# Patient Record
Sex: Male | Born: 2000 | Race: White | Hispanic: No | Marital: Single | Smoking: Never smoker
Health system: Southern US, Community
[De-identification: ages and names within clinical notes are randomized; demographics above are authoritative.]

## PROBLEM LIST (undated history)

## (undated) DIAGNOSIS — S00432A Contusion of left ear, initial encounter: Secondary | ICD-10-CM

---
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
## 2023-02-15 ENCOUNTER — Ambulatory Visit (INDEPENDENT_AMBULATORY_CARE_PROVIDER_SITE_OTHER): Payer: No Typology Code available for payment source | Admitting: Internal Medicine

## 2023-02-15 VITALS — BP 137/84 | HR 83 | Ht 74.0 in | Wt 207.0 lb

## 2023-02-15 DIAGNOSIS — Z114 Encounter for screening for human immunodeficiency virus [HIV]: Secondary | ICD-10-CM

## 2023-02-15 DIAGNOSIS — Z1321 Encounter for screening for nutritional disorder: Secondary | ICD-10-CM

## 2023-02-15 DIAGNOSIS — Z1329 Encounter for screening for other suspected endocrine disorder: Secondary | ICD-10-CM

## 2023-02-15 DIAGNOSIS — Z0001 Encounter for general adult medical examination with abnormal findings: Secondary | ICD-10-CM

## 2023-02-15 DIAGNOSIS — Z1159 Encounter for screening for other viral diseases: Secondary | ICD-10-CM | POA: Diagnosis not present

## 2023-02-15 DIAGNOSIS — Z131 Encounter for screening for diabetes mellitus: Secondary | ICD-10-CM | POA: Diagnosis not present

## 2023-02-15 DIAGNOSIS — Z13220 Encounter for screening for lipoid disorders: Secondary | ICD-10-CM | POA: Diagnosis not present

## 2023-02-15 NOTE — Progress Notes (Signed)
New Patient Office Visit  Subjective    Patient ID: Max Williams, male    DOB: 2001-06-06  Age: 22 y.o. MRN: [id number]  CC:  Chief Complaint  Patient presents with   New Patient (Initial Visit)    Establishing care, pt reports a bump/lump on testicle has concerns.    HPI Max Williams presents to establish care and for physical exam.  He is a 22 year old male with no significant past medical history.  Not taking any medications regularly.  His acute concern today is a bump at the base of his penis.  He states that it has been present for the last week.  It does is not painful, but causes dull discomfort.  He endorses social alcohol use but denies tobacco and illicit drug use.  He is unaware of any significant family medical history.   No outpatient encounter medications on file as of 02/15/2023.   No facility-administered encounter medications on file as of 02/15/2023.    Past Medical History:  Diagnosis Date   Hematoma of pinna, left ear 08/2017    Past Surgical History:  Procedure Laterality Date   FINE NEEDLE ASPIRATION Left 09/02/2017   Procedure: FINE NEEDLE ASPIRATION OF THE LEFT EAR;  Surgeon: Suzanna Obey, MD;  Location: [facility];  Service: ENT;  Laterality: Left;    History reviewed. No pertinent family history.  Social History   Socioeconomic History   Marital status: Single    Spouse name: Not on file   Number of children: Not on file   Years of education: Not on file   Highest education level: 12th grade  Occupational History   Not on file  Tobacco Use   Smoking status: Never   Smokeless tobacco: Never  Vaping Use   Vaping status: Never Used  Substance and Sexual Activity   Alcohol use: No   Drug use: No   Sexual activity: Not on file  Other Topics Concern   Not on file  Social History Narrative   Not on file   Social Determinants of Health   Financial Resource Strain: Low Risk  (02/15/2023)   Overall Financial Resource Strain  (CARDIA)    Difficulty of Paying Living Expenses: Not hard at all  Food Insecurity: No Food Insecurity (02/15/2023)   Hunger Vital Sign    Worried About Running Out of Food in the Last Year: Never true    Ran Out of Food in the Last Year: Never true  Transportation Needs: No Transportation Needs (02/15/2023)   PRAPARE - Administrator, Civil Service (Medical): No    Lack of Transportation (Non-Medical): No  Physical Activity: Sufficiently Active (02/15/2023)   Exercise Vital Sign    Days of Exercise per Week: 7 days    Minutes of Exercise per Session: 60 min  Stress: No Stress Concern Present (02/15/2023)   [employer] of Occupational Health - Occupational Stress Questionnaire    Feeling of Stress : Not at all  Social Connections: Moderately Isolated (02/15/2023)   Social Connection and Isolation Panel [NHANES]    Frequency of Communication with Friends and Family: More than three times a week    Frequency of Social Gatherings with Friends and Family: More than three times a week    Attends Religious Services: Never    Database administrator or Organizations: Yes    Attends Banker Meetings: 1 to 4 times per year    Marital Status: Never married  Catering manager  Violence: Not on file   Review of Systems  Constitutional:  Negative for chills and fever.  HENT:  Negative for sore throat.   Respiratory:  Negative for cough and shortness of breath.   Cardiovascular:  Negative for chest pain, palpitations and leg swelling.  Gastrointestinal:  Negative for abdominal pain, blood in stool, constipation, diarrhea, nausea and vomiting.  Genitourinary:  Negative for dysuria and hematuria.       Bump at base of penis  Musculoskeletal:  Negative for myalgias.  Skin:  Negative for itching and rash.  Neurological:  Negative for dizziness and headaches.  Psychiatric/Behavioral:  Negative for depression and suicidal ideas.     Objective    BP 137/84   Pulse 83    Ht 6\' 2"  (1.88 m)   Wt 207 lb 0.3 oz (93.9 kg)   SpO2 98%   BMI 26.58 kg/m   Physical Exam Vitals reviewed.  Constitutional:      General: He is not in acute distress.    Appearance: Normal appearance. He is not ill-appearing.  HENT:     Head: Normocephalic and atraumatic.     Right Ear: External ear normal.     Left Ear: External ear normal.     Nose: Nose normal. No congestion or rhinorrhea.     Mouth/Throat:     Mouth: Mucous membranes are moist.     Pharynx: Oropharynx is clear.  Eyes:     General: No scleral icterus.    Extraocular Movements: Extraocular movements intact.     Conjunctiva/sclera: Conjunctivae normal.     Pupils: Pupils are equal, round, and reactive to light.  Cardiovascular:     Rate and Rhythm: Normal rate and regular rhythm.     Pulses: Normal pulses.     Heart sounds: Normal heart sounds. No murmur heard. Pulmonary:     Effort: Pulmonary effort is normal.     Breath sounds: Normal breath sounds. No wheezing, rhonchi or rales.  Abdominal:     General: Abdomen is flat. Bowel sounds are normal. There is no distension.     Palpations: Abdomen is soft.     Tenderness: There is no abdominal tenderness.  Genitourinary:    Penis: Normal.      Testes: Normal.     Comments: No lesions present on examination of the penile shaft and base.  There is palpable connective tissue at the base of the penis. Musculoskeletal:        General: No swelling or deformity. Normal range of motion.     Cervical back: Normal range of motion.  Skin:    General: Skin is warm and dry.     Capillary Refill: Capillary refill takes less than 2 seconds.  Neurological:     General: No focal deficit present.     Mental Status: He is alert and oriented to person, place, and time.     Motor: No weakness.  Psychiatric:        Mood and Affect: Mood normal.        Behavior: Behavior normal.        Thought Content: Thought content normal.    Assessment & Plan:   Problem List  Items Addressed This Visit       Encounter for well adult exam with abnormal findings    Presenting today for annual exam and to establish care.  Available records and labs have been reviewed. -Baseline labs ordered today, including one-time HIV/HIV screenings -Remaining preventative care items are up-to-date -  He was reassured that there is no lesion at the base of his penis.  What he is palpating is more than likely the suspensory ligament. -He will return to care for annual exam in 1 year      Return in about 1 year (around 02/15/2024) for CPE.   Billie Lade, MD

## 2023-02-15 NOTE — Patient Instructions (Signed)
It was a pleasure to see you today.  Thank you for giving [place] the opportunity to be involved in your care.  Below is a brief recap of your visit and next steps.  We will plan to see you again in 1 year.   Summary You have established care today We will check basic labs Follow up in 1 year

## 2023-02-15 NOTE — Assessment & Plan Note (Signed)
Presenting today for annual exam and to establish care.  Available records and labs have been reviewed. -Baseline labs ordered today, including one-time HIV/HIV screenings -Remaining preventative care items are up-to-date -He was reassured that there is no lesion at the base of his penis.  What he is palpating is more than likely the suspensory ligament. -He will return to care for annual exam in 1 year

## 2023-02-17 LAB — CBC WITH DIFFERENTIAL/PLATELET
Basophils Absolute: 0 10*3/uL (ref 0.0–0.2)
Basos: 1 %
EOS (ABSOLUTE): 0.1 10*3/uL (ref 0.0–0.4)
Eos: 1 %
Hematocrit: 46 % (ref 37.5–51.0)
Hemoglobin: 15.3 g/dL (ref 13.0–17.7)
Immature Grans (Abs): 0 10*3/uL (ref 0.0–0.1)
Immature Granulocytes: 0 %
Lymphocytes Absolute: 1.6 10*3/uL (ref 0.7–3.1)
Lymphs: 27 %
MCH: 29.7 pg (ref 26.6–33.0)
MCHC: 33.3 g/dL (ref 31.5–35.7)
MCV: 89 fL (ref 79–97)
Monocytes Absolute: 0.5 10*3/uL (ref 0.1–0.9)
Monocytes: 8 %
Neutrophils Absolute: 3.7 10*3/uL (ref 1.4–7.0)
Neutrophils: 63 %
Platelets: 398 10*3/uL (ref 150–450)
RBC: 5.16 x10E6/uL (ref 4.14–5.80)
RDW: 12.7 % (ref 11.6–15.4)
WBC: 5.9 10*3/uL (ref 3.4–10.8)

## 2023-02-17 LAB — HEMOGLOBIN A1C
Est. average glucose Bld gHb Est-mCnc: 114 mg/dL
Hgb A1c MFr Bld: 5.6 % (ref 4.8–5.6)

## 2023-02-17 LAB — CMP14+EGFR
ALT: 17 IU/L (ref 0–44)
AST: 17 IU/L (ref 0–40)
Albumin: 4.8 g/dL (ref 4.3–5.2)
Alkaline Phosphatase: 72 IU/L (ref 51–125)
BUN/Creatinine Ratio: 11 (ref 9–20)
BUN: 14 mg/dL (ref 6–20)
Bilirubin Total: 0.5 mg/dL (ref 0.0–1.2)
CO2: 24 mmol/L (ref 20–29)
Calcium: 10 mg/dL (ref 8.7–10.2)
Chloride: 101 mmol/L (ref 96–106)
Creatinine, Ser: 1.29 mg/dL — ABNORMAL HIGH (ref 0.76–1.27)
Globulin, Total: 2.7 g/dL (ref 1.5–4.5)
Glucose: 84 mg/dL (ref 70–99)
Potassium: 4.7 mmol/L (ref 3.5–5.2)
Sodium: 140 mmol/L (ref 134–144)
Total Protein: 7.5 g/dL (ref 6.0–8.5)
eGFR: 81 mL/min/{1.73_m2} (ref >59)

## 2023-02-17 LAB — HCV AB W REFLEX TO QUANT PCR: HCV Ab: NONREACTIVE

## 2023-02-17 LAB — TSH+FREE T4
Free T4: 1.18 ng/dL (ref 0.82–1.77)
TSH: 2.12 u[IU]/mL (ref 0.450–4.500)

## 2023-02-17 LAB — HCV INTERPRETATION

## 2023-02-17 LAB — LIPID PANEL
Chol/HDL Ratio: 3 ratio (ref 0.0–5.0)
Cholesterol, Total: 152 mg/dL (ref 100–199)
HDL: 50 mg/dL (ref >39)
LDL Chol Calc (NIH): 82 mg/dL (ref 0–99)
Triglycerides: 111 mg/dL (ref 0–149)
VLDL Cholesterol Cal: 20 mg/dL (ref 5–40)

## 2023-02-17 LAB — HIV ANTIBODY (ROUTINE TESTING W REFLEX): HIV Screen 4th Generation wRfx: NONREACTIVE

## 2023-02-17 LAB — B12 AND FOLATE PANEL
Folate: 10.6 ng/mL (ref >3.0)
Vitamin B-12: 506 pg/mL (ref 232–1245)

## 2023-02-17 LAB — VITAMIN D 25 HYDROXY (VIT D DEFICIENCY, FRACTURES): Vit D, 25-Hydroxy: 39.7 ng/mL (ref 30.0–100.0)

## 2023-02-27 ENCOUNTER — Other Ambulatory Visit: Payer: Self-pay

## 2023-02-27 ENCOUNTER — Telehealth: Payer: Self-pay | Admitting: Internal Medicine

## 2023-02-27 DIAGNOSIS — R7989 Other specified abnormal findings of blood chemistry: Secondary | ICD-10-CM

## 2023-02-27 NOTE — Telephone Encounter (Signed)
Patient returning lab results call 

## 2023-02-27 NOTE — Telephone Encounter (Signed)
Spoke with patient.
